# Patient Record
Sex: Male | Born: 1975 | Hispanic: No | Marital: Married | Smoking: Never smoker
Health system: Southern US, Community
[De-identification: ages and names within clinical notes are randomized; demographics above are authoritative.]

## PROBLEM LIST (undated history)

## (undated) ENCOUNTER — Ambulatory Visit

## (undated) DIAGNOSIS — E119 Type 2 diabetes mellitus without complications: Secondary | ICD-10-CM

## (undated) HISTORY — PX: ELBOW ARTHROSCOPY: SHX614

## (undated) HISTORY — PX: CLAVICLE SURGERY: SHX598

## (undated) HISTORY — DX: Type 2 diabetes mellitus without complications: E11.9

## (undated) HISTORY — PX: SHOULDER ARTHROSCOPY: SHX128

## (undated) HISTORY — PX: EXCISION ORAL TUMOR: SHX6265

---

## 2005-01-04 ENCOUNTER — Ambulatory Visit (HOSPITAL_COMMUNITY): Admission: RE | Admit: 2005-01-04 | Discharge: 2005-01-05 | Payer: Self-pay | Admitting: Orthopedic Surgery

## 2005-10-31 ENCOUNTER — Ambulatory Visit (HOSPITAL_BASED_OUTPATIENT_CLINIC_OR_DEPARTMENT_OTHER): Admission: RE | Admit: 2005-10-31 | Discharge: 2005-10-31 | Payer: Self-pay | Admitting: Orthopedic Surgery

## 2005-11-18 ENCOUNTER — Emergency Department (HOSPITAL_COMMUNITY): Admission: EM | Admit: 2005-11-18 | Discharge: 2005-11-19 | Payer: Self-pay | Admitting: Emergency Medicine

## 2006-02-20 ENCOUNTER — Ambulatory Visit (HOSPITAL_BASED_OUTPATIENT_CLINIC_OR_DEPARTMENT_OTHER): Admission: RE | Admit: 2006-02-20 | Discharge: 2006-02-20 | Payer: Self-pay | Admitting: Orthopedic Surgery

## 2012-07-25 ENCOUNTER — Emergency Department (HOSPITAL_COMMUNITY)
Admission: EM | Admit: 2012-07-25 | Discharge: 2012-07-25 | Disposition: A | Discharge status: Home / Self Care | Payer: No Typology Code available for payment source | Attending: Emergency Medicine | Admitting: Emergency Medicine

## 2012-07-25 ENCOUNTER — Encounter (HOSPITAL_COMMUNITY): Payer: Self-pay

## 2012-07-25 ENCOUNTER — Emergency Department (HOSPITAL_COMMUNITY): Payer: No Typology Code available for payment source

## 2012-07-25 DIAGNOSIS — N200 Calculus of kidney: Secondary | ICD-10-CM | POA: Insufficient documentation

## 2012-07-25 LAB — URINE MICROSCOPIC-ADD ON

## 2012-07-25 LAB — URINALYSIS, ROUTINE W REFLEX MICROSCOPIC
Glucose, UA: 250 mg/dL — AB
Leukocytes, UA: NEGATIVE
Protein, ur: 100 mg/dL — AB
pH: 6 (ref 5.0–8.0)

## 2012-07-25 LAB — BASIC METABOLIC PANEL
Calcium: 9.3 mg/dL (ref 8.4–10.5)
GFR calc Af Amer: >90 mL/min (ref >90)
GFR calc non Af Amer: >90 mL/min (ref >90)
Glucose, Bld: 218 mg/dL — ABNORMAL HIGH (ref 70–99)
Potassium: 4 mEq/L (ref 3.5–5.1)
Sodium: 132 mEq/L — ABNORMAL LOW (ref 135–145)

## 2012-07-25 MED ORDER — HYDROCODONE-ACETAMINOPHEN 5-325 MG PO TABS
1.0000 | ORAL_TABLET | Freq: Once | ORAL | Status: AC
  Administered 2012-07-25: 1 via ORAL
  Filled 2012-07-25: qty 1

## 2012-07-25 MED ORDER — IBUPROFEN 800 MG PO TABS
800.0000 mg | ORAL_TABLET | Freq: Once | ORAL | Status: AC
  Administered 2012-07-25: 800 mg via ORAL
  Filled 2012-07-25: qty 1

## 2012-07-25 MED ORDER — HYDROCODONE-ACETAMINOPHEN 5-325 MG PO TABS: 1.0000 | ORAL_TABLET | ORAL | Status: DC | PRN

## 2012-07-25 MED ORDER — ONDANSETRON 4 MG PO TBDP: 4.0000 mg | ORAL_TABLET | Freq: Three times a day (TID) | ORAL | Status: DC | PRN

## 2012-07-25 NOTE — ED Provider Notes (Signed)
History     CSN: [id number]  Arrival date & time 07/25/12  0103   First MD Initiated Contact with Patient 07/25/12 0208      Chief Complaint  Patient presents with  . Flank Pain    (Consider location/radiation/quality/duration/timing/severity/associated sxs/prior treatment) HPI HPI Comments: Kenneth Decker is a 37 y.o. male who presents to the Emergency Department complaining of  Right flank pain and right abdominal pain that woke him from sleep. Pain is continuous. He has no history of kidney stones. His wife advised he has had  pain in his  Right back for three days with radiation to his groin. Denies fever, chills, nausea, vomiting, hematuria, difficulty urinating. History reviewed. No pertinent past medical history.  History reviewed. No pertinent past surgical history.  No family history on file.  History  Substance Use Topics  . Smoking status: Never Smoker   . Smokeless tobacco: Not on file  . Alcohol Use: No      Review of Systems  Constitutional: Negative for fever.       10 Systems reviewed and are negative for acute change except as noted in the HPI.  HENT: Negative for congestion.   Eyes: Negative for discharge and redness.  Respiratory: Negative for cough and shortness of breath.   Cardiovascular: Negative for chest pain.  Gastrointestinal: Positive for abdominal pain. Negative for vomiting.  Genitourinary: Positive for flank pain.  Musculoskeletal: Negative for back pain.  Skin: Negative for rash.  Neurological: Negative for syncope, numbness and headaches.  Psychiatric/Behavioral:       No behavior change.    Allergies  Review of patient's allergies indicates no known allergies.  Home Medications  No current outpatient prescriptions on file.  BP 128/81  Pulse 83  Temp(Src) 98.9 F (37.2 C) (Oral)  Resp 18  Ht 6\' 1"  (1.854 m)  Wt 235 lb (106.595 kg)  BMI 31.01 kg/m2  SpO2 100%  Physical Exam  Nursing note and vitals  reviewed. Constitutional: He appears well-developed and well-nourished.  Awake, alert, nontoxic appearance.  HENT:  Head: Normocephalic and atraumatic.  Eyes: EOM are normal. Pupils are equal, round, and reactive to light.  Neck: Normal range of motion. Neck supple.  Cardiovascular: Normal rate and intact distal pulses.   Pulmonary/Chest: Effort normal and breath sounds normal. He exhibits no tenderness.  Abdominal: Soft. There is no tenderness. There is no rebound.  Genitourinary:  Right cva tenderness to percussion  Musculoskeletal: He exhibits no tenderness.  Baseline ROM, no obvious new focal weakness.  Neurological:  Mental status and motor strength appears baseline for patient and situation.  Skin: No rash noted.  Psychiatric: He has a normal mood and affect.    ED Course  Procedures (including critical care time) Results for orders placed during the hospital encounter of 07/25/12  URINALYSIS, ROUTINE W REFLEX MICROSCOPIC      Result Value Range   Color, Urine BROWN (*) YELLOW   APPearance HAZY (*) CLEAR   Specific Gravity, Urine >1.030 (*) 1.005 - 1.030   pH 6.0  5.0 - 8.0   Glucose, UA 250 (*) NEGATIVE mg/dL   Hgb urine dipstick LARGE (*) NEGATIVE   Bilirubin Urine NEGATIVE  NEGATIVE   Ketones, ur 15 (*) NEGATIVE mg/dL   Protein, ur 409 (*) NEGATIVE mg/dL   Urobilinogen, UA 1.0  0.0 - 1.0 mg/dL   Nitrite NEGATIVE  NEGATIVE   Leukocytes, UA NEGATIVE  NEGATIVE  URINE MICROSCOPIC-ADD ON      Result Value  Range   RBC / HPF TOO NUMEROUS TO COUNT  <3 RBC/hpf   Bacteria, UA MANY (*) RARE   Urine-Other MUCOUS PRESENT     Ct Abdomen Pelvis Wo Contrast  07/25/2012   *RADIOLOGY REPORT*  Clinical Data: Right flank pain and hematuria.  CT ABDOMEN AND PELVIS WITHOUT CONTRAST  Technique:  Multidetector CT imaging of the abdomen and pelvis was performed following the standard protocol without intravenous contrast.  Comparison: None  Findings: The lung bases are clear.  The solid  abdominal organs are normal. The gallbladder is normal. No common bile duct dilatation.  No renal calculi or hydronephrosis.  There is however the 3 mm right mid ureteral calculus located at the L3 vertebral body level without obstructive findings.  No left-sided ureteral calculi.  No bladder calculi.  The stomach, duodenum, small bowel and colon are grossly normal without oral contrast.  The appendix is normal.  The bladder, prostate gland and seminal vesicles are normal.  No pelvic mass or adenopathy.  The aorta is normal in caliber.  No mesenteric or retroperitoneal mass or adenopathy.  IMPRESSION: 3 mm nonobstructing right mid ureteral calculus.   Original Report Authenticated By: Rudie Meyer, M.D.   Medications  HYDROcodone-acetaminophen (NORCO/VICODIN) 5-325 MG per tablet 1 tablet (1 tablet Oral Given 07/25/12 0228)  ibuprofen (ADVIL,MOTRIN) tablet 800 mg (800 mg Oral Given 07/25/12 0228)      MDM  Patient presents with right sided flank pain and abdominal pain. UA with hgb. CT scan showing a 3 mm mid ureteral stone. Satinet was given hydrocodone and ibuprofen for pain with relief. Reviewed results with patient and his wife. Referral to urology. Pt stable in ED with no significant deterioration in condition.The patient appears reasonably screened and/or stabilized for discharge and I doubt any other medical condition or other [facility] requiring further screening, evaluation, or treatment in the ED at this time prior to discharge.  MDM Reviewed: nursing note and vitals Interpretation: labs and CT scan           Nicoletta Dress. Colon Branch, MD 07/25/12 [phone]

## 2012-10-25 ENCOUNTER — Ambulatory Visit (INDEPENDENT_AMBULATORY_CARE_PROVIDER_SITE_OTHER): Payer: No Typology Code available for payment source | Admitting: Internal Medicine

## 2012-10-25 ENCOUNTER — Ambulatory Visit: Payer: No Typology Code available for payment source

## 2012-10-25 ENCOUNTER — Encounter: Payer: Self-pay | Admitting: Internal Medicine

## 2012-10-25 VITALS — BP 120/70 | HR 77 | Temp 99.0°F | Resp 16 | Ht 72.25 in | Wt 227.4 lb

## 2012-10-25 DIAGNOSIS — S139XXA Sprain of joints and ligaments of unspecified parts of neck, initial encounter: Secondary | ICD-10-CM

## 2012-10-25 DIAGNOSIS — R079 Chest pain, unspecified: Secondary | ICD-10-CM

## 2012-10-25 DIAGNOSIS — S161XXA Strain of muscle, fascia and tendon at neck level, initial encounter: Secondary | ICD-10-CM

## 2012-10-25 DIAGNOSIS — M542 Cervicalgia: Secondary | ICD-10-CM

## 2012-10-25 DIAGNOSIS — R110 Nausea: Secondary | ICD-10-CM

## 2012-10-25 LAB — POCT CBC
HCT, POC: 49.8 % (ref 43.5–53.7)
Hemoglobin: 16.2 g/dL (ref 14.1–18.1)
Lymph, poc: 1.3 (ref 0.6–3.4)
MCH, POC: 30.2 pg (ref 27–31.2)
MCHC: 32.5 g/dL (ref 31.8–35.4)
MCV: 92.7 fL (ref 80–97)
POC Granulocyte: 4.3 (ref 2–6.9)
POC LYMPH PERCENT: 21.6 %L (ref 10–50)
RDW, POC: 14.1 %
WBC: 5.9 10*3/uL (ref 4.6–10.2)

## 2012-10-25 LAB — POCT SEDIMENTATION RATE: POCT SED RATE: 4 mm/hr (ref 0–22)

## 2012-10-25 MED ORDER — METHOCARBAMOL 750 MG PO TABS: 750.0000 mg | ORAL_TABLET | Freq: Four times a day (QID) | ORAL | Status: DC

## 2012-10-25 MED ORDER — HYDROCODONE-ACETAMINOPHEN 5-325 MG PO TABS: 1.0000 | ORAL_TABLET | Freq: Four times a day (QID) | ORAL | Status: DC | PRN

## 2012-10-25 NOTE — Progress Notes (Signed)
  Subjective:    Patient ID: Kenneth Decker, male    DOB: May 04, 1975, 37 y.o.   MRN: [id number]  HPI    Review of Systems     Objective:   Physical Exam        Assessment & Plan:

## 2012-10-25 NOTE — Progress Notes (Signed)
  Subjective:    Patient ID: Kenneth Decker, male    DOB: 12-06-1975, 37 y.o.   MRN: [id number]  HPI  37 year old male for shoulder and neck pain for one week and went into the chest today. Pain in the chest is constant.  Feels some nausea today afraid to eat today.   No other symptoms.  No known injury.  Pain goes down to shoulder on left side.  Throbbing pain in chest.  No shortness of breath.  Painful to flex or extend neck, rotating left worse than right. No remembered trauma.    Review of Systems     Objective:   Physical Exam  Vitals reviewed. Constitutional: He is oriented to person, place, and time. He appears well-developed and well-nourished. He appears distressed.  HENT:  Right Ear: External ear normal.  Left Ear: External ear normal.  Nose: Nose normal.  Mouth/Throat: Oropharynx is clear and moist.  Eyes: EOM are normal. Pupils are equal, round, and reactive to light.  Neck: Neck supple. No tracheal deviation present. No thyromegaly present.  Cardiovascular: Normal rate, regular rhythm, normal heart sounds and intact distal pulses.   Pulmonary/Chest: Effort normal.  Musculoskeletal:       Cervical back: He exhibits decreased range of motion, tenderness, pain and spasm. He exhibits no bony tenderness, no swelling, no edema, no deformity, no laceration and normal pulse.       Back:  Lymphadenopathy:    He has no cervical adenopathy.  Neurological: He is alert and oriented to person, place, and time. He has normal strength and normal reflexes. He displays no atrophy. No cranial nerve deficit or sensory deficit. He exhibits normal muscle tone. Coordination normal.  Psychiatric: He has a normal mood and affect. His behavior is normal.  in pain   [facility] reading (PRIMARY) by  Dr Perrin Maltese. C6 appears compressed, rest nl and cxr nl  EKG nl Results for orders placed in visit on 10/25/12  POCT CBC      Result Value Range   WBC 5.9  4.6 - 10.2 K/uL   Lymph, poc 1.3  0.6 - 3.4   POC LYMPH PERCENT 21.6  10 - 50 %L   MID (cbc) 0.4  0 - 0.9   POC MID % 6.3  0 - 12 %M   POC Granulocyte 4.3  2 - 6.9   Granulocyte percent 72.1  37 - 80 %G   RBC 5.37  4.69 - 6.13 M/uL   Hemoglobin 16.2  14.1 - 18.1 g/dL   HCT, POC 14.7  82.9 - 53.7 %   MCV 92.7  80 - 97 fL   MCH, POC 30.2  27 - 31.2 pg   MCHC 32.5  31.8 - 35.4 g/dL   RDW, POC 56.2     Platelet Count, POC 231  142 - 424 K/uL   MPV 9.2  0 - 99.8 fL       Assessment & Plan:  Stop tobacco-Nicorrete discussed RICE/Neck care/Robaxin/Vicodin Reck 3days if not better and consider MRI

## 2012-10-25 NOTE — Patient Instructions (Signed)
Gastroesophageal Reflux Disease, Adult  Gastroesophageal reflux disease (GERD) happens when acid from your stomach flows up into the esophagus. When acid comes in contact with the esophagus, the acid causes soreness (inflammation) in the esophagus. Over time, GERD may create small holes (ulcers) in the lining of the esophagus.  CAUSES   · Increased body weight. This puts pressure on the stomach, making acid rise from the stomach into the esophagus.  · Smoking. This increases acid production in the stomach.  · Drinking alcohol. This causes decreased pressure in the lower esophageal sphincter (valve or ring of muscle between the esophagus and stomach), allowing acid from the stomach into the esophagus.  · Late evening meals and a full stomach. This increases pressure and acid production in the stomach.  · A malformed lower esophageal sphincter.  Sometimes, no cause is found.  SYMPTOMS   · Burning pain in the lower part of the mid-chest behind the breastbone and in the mid-stomach area. This may occur twice a week or more often.  · Trouble swallowing.  · Sore throat.  · Dry cough.  · Asthma-like symptoms including chest tightness, shortness of breath, or wheezing.  DIAGNOSIS   Your caregiver may be able to diagnose GERD based on your symptoms. In some cases, X-rays and other tests may be done to check for complications or to check the condition of your stomach and esophagus.  TREATMENT   Your caregiver may recommend over-the-counter or prescription medicines to help decrease acid production. Ask your caregiver before starting or adding any new medicines.   HOME CARE INSTRUCTIONS   · Change the factors that you can control. Ask your caregiver for guidance concerning weight loss, quitting smoking, and alcohol consumption.  · Avoid foods and drinks that make your symptoms worse, such as:  · Caffeine or alcoholic drinks.  · Chocolate.  · Peppermint or mint flavorings.  · Garlic and onions.  · Spicy foods.  · Citrus fruits,  such as oranges, lemons, or limes.  · Tomato-based foods such as sauce, chili, salsa, and pizza.  · Fried and fatty foods.  · Avoid lying down for the 3 hours prior to your bedtime or prior to taking a nap.  · Eat small, frequent meals instead of large meals.  · Wear loose-fitting clothing. Do not wear anything tight around your waist that causes pressure on your stomach.  · Raise the head of your bed 6 to 8 inches with wood blocks to help you sleep. Extra pillows will not help.  · Only take over-the-counter or prescription medicines for pain, discomfort, or fever as directed by your caregiver.  · Do not take aspirin, ibuprofen, or other nonsteroidal anti-inflammatory drugs (NSAIDs).  SEEK IMMEDIATE MEDICAL CARE IF:   · You have pain in your arms, neck, jaw, teeth, or back.  · Your pain increases or changes in intensity or duration.  · You develop nausea, vomiting, or sweating (diaphoresis).  · You develop shortness of breath, or you faint.  · Your vomit is green, yellow, black, or looks like coffee grounds or blood.  · Your stool is red, bloody, or black.  These symptoms could be signs of other problems, such as heart disease, gastric bleeding, or esophageal bleeding.  MAKE SURE YOU:   · Understand these instructions.  · Will watch your condition.  · Will get help right away if you are not doing well or get worse.  Document Released: 11/30/2004 Document Revised: 05/15/2011 Document Reviewed: 09/09/2010  [employer]® Patient   Information ©2014 [employer].

## 2015-04-11 ENCOUNTER — Ambulatory Visit (INDEPENDENT_AMBULATORY_CARE_PROVIDER_SITE_OTHER): Payer: 59 | Admitting: Physician Assistant

## 2015-04-11 VITALS — BP 116/88 | HR 76 | Temp 98.3°F | Resp 16 | Ht 74.0 in | Wt 224.0 lb

## 2015-04-11 DIAGNOSIS — Z13220 Encounter for screening for lipoid disorders: Secondary | ICD-10-CM | POA: Diagnosis not present

## 2015-04-11 DIAGNOSIS — E119 Type 2 diabetes mellitus without complications: Secondary | ICD-10-CM | POA: Diagnosis not present

## 2015-04-11 LAB — COMPREHENSIVE METABOLIC PANEL
ALT: 26 U/L (ref 9–46)
AST: 15 U/L (ref 10–40)
Albumin: 4.7 g/dL (ref 3.6–5.1)
Alkaline Phosphatase: 82 U/L (ref 40–115)
BILIRUBIN TOTAL: 0.7 mg/dL (ref 0.2–1.2)
BUN: 14 mg/dL (ref 7–25)
CALCIUM: 9.3 mg/dL (ref 8.6–10.3)
CHLORIDE: 100 mmol/L (ref 98–110)
CO2: 29 mmol/L (ref 20–31)
CREATININE: 0.7 mg/dL (ref 0.60–1.35)
Glucose, Bld: 146 mg/dL — ABNORMAL HIGH (ref 65–99)
POTASSIUM: 4 mmol/L (ref 3.5–5.3)
Sodium: 136 mmol/L (ref 135–146)
TOTAL PROTEIN: 7.1 g/dL (ref 6.1–8.1)

## 2015-04-11 LAB — LIPID PANEL
CHOLESTEROL: 172 mg/dL (ref 125–200)
HDL: 31 mg/dL — ABNORMAL LOW (ref >=40)
LDL Cholesterol: 89 mg/dL (ref <130)
Total CHOL/HDL Ratio: 5.5 Ratio — ABNORMAL HIGH (ref <=5.0)
Triglycerides: 262 mg/dL — ABNORMAL HIGH (ref <150)
VLDL: 52 mg/dL — AB (ref <30)

## 2015-04-11 LAB — CBC
HCT: 46.8 % (ref 39.0–52.0)
Hemoglobin: 16.4 g/dL (ref 13.0–17.0)
MCH: 30.3 pg (ref 26.0–34.0)
MCHC: 35 g/dL (ref 30.0–36.0)
MCV: 86.5 fL (ref 78.0–100.0)
MPV: 10.4 fL (ref 8.6–12.4)
PLATELETS: 225 10*3/uL (ref 150–400)
RBC: 5.41 MIL/uL (ref 4.22–5.81)
RDW: 13.6 % (ref 11.5–15.5)
WBC: 4.9 10*3/uL (ref 4.0–10.5)

## 2015-04-11 LAB — POCT GLYCOSYLATED HEMOGLOBIN (HGB A1C)
Hemoglobin A1C: 10.4
Hemoglobin A1C: 8.8

## 2015-04-11 LAB — MICROALBUMIN, URINE: Microalb, Ur: 12.3 mg/dL

## 2015-04-11 MED ORDER — METFORMIN HCL 1000 MG PO TABS: 1000.0000 mg | ORAL_TABLET | Freq: Two times a day (BID) | ORAL | Status: AC

## 2015-04-11 NOTE — Patient Instructions (Addendum)
Fasting blood sugar, goal <140 Nonfasting, goal <180-200 Goal A1C <7.0  Continue to work on diet, exercise and weight loss. Check blood sugar daily, esp fasting when you wake up. Take metformin 1000 mg twice a day. Return in 3 months for follow up.  Next time we will talk about getting the pneumonia vaccine.

## 2015-04-11 NOTE — Progress Notes (Signed)
[facility] [address] [place] [address] [phone]  Date:  04/11/2015   Name:  Kenneth Decker   DOB:  10-Jun-1975   MRN:  [id number]  PCP:  No PCP Per Patient   Chief Complaint: Medication Refill  History of Present Illness:  This is a 40 y.o. male with PMH DM2 who is presenting needing refill of metformin. He has never been seen here before. States he was at an urgent care in [place], [place] 1 month ago and told he has diabetes. Blood glucose 400 at that time. Placed on metformin 500 mg BID. Doing well with the metformin - had diarrhea first couple days, has gone away now.  Has changed his diet tremendously over the past 1 month. Was drinking 4 sun drops per day. Has not had any soda since. Staying away from white, carby foods. No desserts. He is walking 1 mile every night with his wife for exercise. Checking BG daily - low 101, high 199 Thinks he has lost 7-8 pounds in the past 1 month. Dad has DM2. Skeptical of vaccines - declines flu and pneumonia today. No PCP. Denies abdominal pain, cp, sob, paresthesias.  Review of Systems:  Review of Systems See HPI  There are no active problems to display for this patient.   Prior to Admission medications   Medication Sig Start Date End Date Taking? Authorizing Provider  metFORMIN (GLUCOPHAGE) 500 MG tablet Take 500 mg by mouth 2 (two) times daily with a meal.   Yes Historical Provider, MD    No Known Allergies  History reviewed. No pertinent past surgical history.  Social History  Substance Use Topics  . Smoking status: Never Smoker   . Smokeless tobacco: None  . Alcohol Use: No    History reviewed. No pertinent family history.  Medication list has been reviewed and updated.  Physical Examination:  Physical Exam  Constitutional: He is oriented to person, place, and time. He appears well-developed and well-nourished. No distress.  HENT:  Head: Normocephalic and atraumatic.  Right Ear: Hearing  normal.  Left Ear: Hearing normal.  Nose: Nose normal.  Eyes: Conjunctivae and lids are normal. Right eye exhibits no discharge. Left eye exhibits no discharge. No scleral icterus.  Cardiovascular: Normal rate, regular rhythm, normal heart sounds and normal pulses.   No murmur heard. Pulmonary/Chest: Effort normal and breath sounds normal. No respiratory distress. He has no wheezes. He has no rhonchi. He has no rales.  Musculoskeletal: Normal range of motion.  Lymphadenopathy:    He has no cervical adenopathy.  Neurological: He is alert and oriented to person, place, and time.  Diabetic Foot Exam - Simple   Visual Inspection  No deformities, no ulcerations, no other skin breakdown bilaterally:  Yes  Sensation Testing  Intact to touch and monofilament testing bilaterally:  Yes  Pulse Check  Posterior Tibialis and Dorsalis pulse intact bilaterally:  Yes      Skin: Skin is warm, dry and intact. No lesion and no rash noted.  Psychiatric: He has a normal mood and affect. His speech is normal and behavior is normal. Thought content normal.   BP 116/88 mmHg  Pulse 76  Temp(Src) 98.3 F (36.8 C)  Resp 16  Ht  (1.88 m)  Wt 224 lb (101.606 kg)  BMI 28.75 kg/m2  SpO2 98%  Results for orders placed or performed in visit on 04/11/15  POCT glycosylated hemoglobin (Hb A1C)  Result Value Ref Range  Hemoglobin A1C 10.4    Assessment and Plan:  1. Type 2 diabetes mellitus without complication, without long-term current use of insulin (HCC) 2. Encounter for diabetic foot exam A1C 10.4. Will increase metformin to 1000 mg BID. Continue to check bg daily. Planted seed today for starting lisinopril 5 mg QD at next visit for kidney protection. Also encouraged him to think about getting pneumonia vaccine. Encouraged to get eye exam. Congratulated on diet/exercise changes - return in 3 months for follow up. - Comprehensive metabolic panel - CBC - Microalbumin, urine - POCT glycosylated  hemoglobin (Hb A1C) - metFORMIN (GLUCOPHAGE) 1000 MG tablet; Take 1 tablet (1,000 mg total) by mouth 2 (two) times daily with a meal.  Dispense: 60 tablet; Refill: 5  3. Lipid screening - Lipid panel   [place] [employer]. Dyke Brackett, MHS [facility]  04/11/2015

## 2015-04-16 ENCOUNTER — Other Ambulatory Visit: Payer: Self-pay | Admitting: Physician Assistant

## 2015-04-16 DIAGNOSIS — E119 Type 2 diabetes mellitus without complications: Secondary | ICD-10-CM

## 2015-04-16 DIAGNOSIS — E785 Hyperlipidemia, unspecified: Secondary | ICD-10-CM | POA: Insufficient documentation

## 2015-04-16 MED ORDER — ATORVASTATIN CALCIUM 10 MG PO TABS: 10.0000 mg | ORAL_TABLET | Freq: Every day | ORAL | Status: AC

## 2015-06-29 ENCOUNTER — Ambulatory Visit (INDEPENDENT_AMBULATORY_CARE_PROVIDER_SITE_OTHER): Payer: 59 | Admitting: Family Medicine

## 2015-06-29 ENCOUNTER — Encounter: Payer: Self-pay | Admitting: Family Medicine

## 2015-06-29 VITALS — BP 134/82 | HR 81 | Temp 98.5°F | Resp 16 | Ht 74.0 in | Wt 225.0 lb

## 2015-06-29 DIAGNOSIS — E119 Type 2 diabetes mellitus without complications: Secondary | ICD-10-CM | POA: Diagnosis not present

## 2015-06-29 DIAGNOSIS — N529 Male erectile dysfunction, unspecified: Secondary | ICD-10-CM

## 2015-06-29 LAB — GLUCOSE, POCT (MANUAL RESULT ENTRY): POC Glucose: 179 mg/dl — AB (ref 70–99)

## 2015-06-29 NOTE — Patient Instructions (Addendum)
   IF you received an x-ray today, you will receive an invoice from [facility]. Please contact [facility] at [phone] with questions or concerns regarding your invoice.   IF you received labwork today, you will receive an invoice from [facility]. Please contact [facility] at [phone] with questions or concerns regarding your invoice.   Our billing staff will not be able to assist you with questions regarding bills from these companies.  You will be contacted with the lab results as soon as they are available. The fastest way to get your results is to activate your My Chart account. Instructions are located on the last page of this paperwork. If you have not heard from us regarding the results in 2 weeks, please contact this office.       Why follow it? Research shows. . Those who follow the Mediterranean diet have a reduced risk of heart disease  . The diet is associated with a reduced incidence of Parkinson's and Alzheimer's diseases . People following the diet may have longer life expectancies and lower rates of chronic diseases  . The Dietary Guidelines for Americans recommends the Mediterranean diet as an eating plan to promote health and prevent disease  What Is the Mediterranean Diet?  . Healthy eating plan based on typical foods and recipes of Mediterranean-style cooking . The diet is primarily a plant based diet; these foods should make up a majority of meals   Starches - Plant based foods should make up a majority of meals - They are an important sources of vitamins, minerals, energy, antioxidants, and fiber - Choose whole grains, foods high in fiber and minimally processed items  - Typical grain sources include wheat, oats, barley, corn, brown rice, bulgar, farro, millet, polenta, couscous  - Various types of beans include chickpeas, lentils, fava beans, black beans, white beans   Fruits  Veggies - Large quantities of  antioxidant rich fruits & veggies; 6 or more servings  - Vegetables can be eaten raw or lightly drizzled with oil and cooked  - Vegetables common to the traditional Mediterranean Diet include: artichokes, arugula, beets, broccoli, brussel sprouts, cabbage, carrots, celery, collard greens, cucumbers, eggplant, kale, leeks, lemons, lettuce, mushrooms, okra, onions, peas, peppers, potatoes, pumpkin, radishes, rutabaga, shallots, spinach, sweet potatoes, turnips, zucchini - Fruits common to the Mediterranean Diet include: apples, apricots, avocados, cherries, clementines, dates, figs, grapefruits, grapes, melons, nectarines, oranges, peaches, pears, pomegranates, strawberries, tangerines  Fats - Replace butter and margarine with healthy oils, such as olive oil, canola oil, and tahini  - Limit nuts to no more than a handful a day  - Nuts include walnuts, almonds, pecans, pistachios, pine nuts  - Limit or avoid candied, honey roasted or heavily salted nuts - Olives are central to the Mediterranean diet - can be eaten whole or used in a variety of dishes   Meats Protein - Limiting red meat: no more than a few times a month - When eating red meat: choose lean cuts and keep the portion to the size of deck of cards - Eggs: approx. 0 to 4 times a week  - Fish and lean poultry: at least 2 a week  - Healthy protein sources include, chicken, turkey, lean beef, lamb - Increase intake of seafood such as tuna, salmon, trout, mackerel, shrimp, scallops - Avoid or limit high fat processed meats such as sausage and bacon  Dairy - Include moderate amounts of low fat dairy products  - Focus on healthy   dairy such as fat free yogurt, skim milk, low or reduced fat cheese - Limit dairy products higher in fat such as whole or 2% milk, cheese, ice cream  Alcohol - Moderate amounts of red wine is ok  - No more than 5 oz daily for women (all ages) and men older than age 65  - No more than 10 oz of wine daily for men younger  than 65  Other - Limit sweets and other desserts  - Use herbs and spices instead of salt to flavor foods  - Herbs and spices common to the traditional Mediterranean Diet include: basil, bay leaves, chives, cloves, cumin, fennel, garlic, lavender, marjoram, mint, oregano, parsley, pepper, rosemary, sage, savory, sumac, tarragon, thyme   It's not just a diet, it's a lifestyle:  . The Mediterranean diet includes lifestyle factors typical of those in the region  . Foods, drinks and meals are best eaten with others and savored . Daily physical activity is important for overall good health . This could be strenuous exercise like running and aerobics . This could also be more leisurely activities such as walking, housework, yard-work, or taking the stairs . Moderation is the key; a balanced and healthy diet accommodates most foods and drinks . Consider portion sizes and frequency of consumption of certain foods   Meal Ideas & Options:  . Breakfast:  o Whole wheat toast or whole wheat English muffins with peanut butter & hard boiled egg o Steel cut oats topped with apples & cinnamon and skim milk  o Fresh fruit: banana, strawberries, melon, berries, peaches  o Smoothies: strawberries, bananas, greek yogurt, peanut butter o Low fat greek yogurt with blueberries and granola  o Egg white omelet with spinach and mushrooms o Breakfast couscous: whole wheat couscous, apricots, skim milk, cranberries  . Sandwiches:  o Hummus and grilled vegetables (peppers, zucchini, squash) on whole wheat bread   o Grilled chicken on whole wheat pita with lettuce, tomatoes, cucumbers or tzatziki  o Tuna salad on whole wheat bread: tuna salad made with greek yogurt, olives, red peppers, capers, green onions o Garlic rosemary lamb pita: lamb sauted with garlic, rosemary, salt & pepper; add lettuce, cucumber, greek yogurt to pita - flavor with lemon juice and black pepper  . Seafood:  o Mediterranean grilled salmon,  seasoned with garlic, basil, parsley, lemon juice and black pepper o Shrimp, lemon, and spinach whole-grain pasta salad made with low fat greek yogurt  o Seared scallops with lemon orzo  o Seared tuna steaks seasoned salt, pepper, coriander topped with tomato mixture of olives, tomatoes, olive oil, minced garlic, parsley, green onions and cappers  . Meats:  o Herbed greek chicken salad with kalamata olives, cucumber, feta  o Red bell peppers stuffed with spinach, bulgur, lean ground beef (or lentils) & topped with feta   o Kebabs: skewers of chicken, tomatoes, onions, zucchini, squash  o Turkey burgers: made with red onions, mint, dill, lemon juice, feta cheese topped with roasted red peppers . Vegetarian o Cucumber salad: cucumbers, artichoke hearts, celery, red onion, feta cheese, tossed in olive oil & lemon juice  o Hummus and whole grain pita points with a greek salad (lettuce, tomato, feta, olives, cucumbers, red onion) o Lentil soup with celery, carrots made with vegetable broth, garlic, salt and pepper  o Tabouli salad: parsley, bulgur, mint, scallions, cucumbers, tomato, radishes, lemon juice, olive oil, salt and pepper.      

## 2015-06-29 NOTE — Progress Notes (Signed)
Subjective:    Patient ID: Kenneth Decker, male    DOB: August 19, 1975, 40 y.o.   MRN: [id number]  HPI This is a pleasant 40 yo male who presents today for follow up of DM. 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 He works an average of 60 hours a week. He was doing really well with his diet at first but has fallen off some. He has changed trucks and no longer has a refrigerator. He has purchased a new cooler and plans to brings more of his food on the road with him. He had an eye exam scheduled for last week, the provider had to cancel and the patient understands the importance of rescheduling. He and his wife do the grocery shopping together and they have been working hard to eliminate unhealthy choices for their sake as well as their 718 yo daughter. He was rapidly losing weight prior to DM diagnosis and starting medications. He was also urinating every hour and was very thirsty. This has resolved.   He has had difficulty maintaining an erection since starting on metformin and atorvastatin. Libido unchanged, no difficulty obtaining an erection. 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   Past Medical History  Diagnosis Date  . Diabetes mellitus without complication (HCC)    History reviewed. No pertinent past surgical history. History reviewed. No pertinent family history. Social History  Substance Use Topics  . Smoking status: Never Smoker   . Smokeless tobacco: None  . Alcohol Use: No      Review of Systems No chest pain, no SOB, no neuropathic symptoms. No sores or rash.     Objective:   Physical Exam Physical Exam  Constitutional: Oriented to person, place, and time. He appears well-developed and well-nourished.  HENT:  Head: Normocephalic and atraumatic.  Eyes: Conjunctivae are normal.  Neck: Normal range of motion. Neck supple.  Cardiovascular: Normal rate, regular rhythm and normal heart sounds.   Pulmonary/Chest: Effort normal and  breath sounds normal.  Musculoskeletal: Normal range of motion.  Neurological: Alert and oriented to person, place, and time.  Skin: Skin is warm and dry.  Psychiatric: Normal mood and affect. Behavior is normal. Judgment and thought content normal.  Vitals reviewed.  BP 134/82 mmHg  Pulse 81  Temp(Src) 98.5 F (36.9 C) (Oral)  Resp 16  Ht 6\' 2"  (1.88 m)  Wt 225 lb (102.059 kg)  BMI 28.88 kg/m2 Wt Readings from Last 3 Encounters:  06/29/15 225 lb (102.059 kg)  04/11/15 224 lb (101.606 kg)  10/25/12 227 lb 6.4 oz (103.148 kg)  Heaviest weight was 255 Results for orders placed or performed in visit on 06/29/15  POCT glycosylated hemoglobin (Hb A1C)  Result Value Ref Range   Hemoglobin A1C 8.8   POCT glucose (manual entry)  Result Value Ref Range   POC Glucose 179 (A) 70 - 99 mg/dl  [id number] 2/9/522/5/17- 84.110.4     Assessment & Plan:  1. Type 2 diabetes mellitus without complication, without long-term current use of insulin (HCC) - POCT glycosylated hemoglobin (Hb A1C) - POCT glucose (manual entry) - discussed improved results with patient and encouraged continued healthy food choices and exercise - reschedule eye exam  2. 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

## 2015-07-13 ENCOUNTER — Ambulatory Visit: Payer: 59 | Admitting: Physician Assistant

## 2016-01-28 ENCOUNTER — Emergency Department (HOSPITAL_COMMUNITY): Payer: 59

## 2016-01-28 ENCOUNTER — Emergency Department (HOSPITAL_COMMUNITY)
Admission: EM | Admit: 2016-01-28 | Discharge: 2016-01-28 | Disposition: A | Discharge status: Home / Self Care | Payer: 59 | Attending: Emergency Medicine | Admitting: Emergency Medicine

## 2016-01-28 ENCOUNTER — Encounter (HOSPITAL_COMMUNITY): Payer: Self-pay | Admitting: Emergency Medicine

## 2016-01-28 DIAGNOSIS — S53402A Unspecified sprain of left elbow, initial encounter: Secondary | ICD-10-CM | POA: Insufficient documentation

## 2016-01-28 DIAGNOSIS — Y9389 Activity, other specified: Secondary | ICD-10-CM | POA: Insufficient documentation

## 2016-01-28 DIAGNOSIS — Y92410 Unspecified street and highway as the place of occurrence of the external cause: Secondary | ICD-10-CM | POA: Diagnosis not present

## 2016-01-28 DIAGNOSIS — Z79899 Other long term (current) drug therapy: Secondary | ICD-10-CM | POA: Insufficient documentation

## 2016-01-28 DIAGNOSIS — Z7984 Long term (current) use of oral hypoglycemic drugs: Secondary | ICD-10-CM | POA: Insufficient documentation

## 2016-01-28 DIAGNOSIS — S59902A Unspecified injury of left elbow, initial encounter: Secondary | ICD-10-CM | POA: Diagnosis present

## 2016-01-28 DIAGNOSIS — E119 Type 2 diabetes mellitus without complications: Secondary | ICD-10-CM | POA: Insufficient documentation

## 2016-01-28 DIAGNOSIS — Y999 Unspecified external cause status: Secondary | ICD-10-CM | POA: Insufficient documentation

## 2016-01-28 MED ORDER — IBUPROFEN 400 MG PO TABS
600.0000 mg | ORAL_TABLET | Freq: Once | ORAL | Status: AC
  Administered 2016-01-28: 600 mg via ORAL
  Filled 2016-01-28: qty 2
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
## 2016-01-28 NOTE — ED Notes (Signed)
Pt alert & oriented x4, stable gait. Patient given discharge instructions, paperwork & prescription(s). Patient  instructed to stop at the registration desk to finish any additional paperwork. Patient verbalized understanding. Pt left department w/ no further questions. 

## 2016-01-28 NOTE — ED Notes (Signed)
Pt states when he moved fingers he is having pain running up his arm to the elbow. Pt has good sensation & cap refill.
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
## 2021-02-19 ENCOUNTER — Ambulatory Visit
Admission: EM | Admit: 2021-02-19 | Discharge: 2021-02-19 | Disposition: A | Discharge status: Home / Self Care | Payer: 59

## 2021-02-19 ENCOUNTER — Other Ambulatory Visit: Payer: Self-pay

## 2021-02-19 DIAGNOSIS — J069 Acute upper respiratory infection, unspecified: Secondary | ICD-10-CM | POA: Diagnosis not present

## 2021-02-19 DIAGNOSIS — Z20828 Contact with and (suspected) exposure to other viral communicable diseases: Secondary | ICD-10-CM | POA: Diagnosis not present

## 2021-02-19 MED ORDER — PREDNISONE 20 MG PO TABS: 40.0000 mg | ORAL_TABLET | Freq: Every day | ORAL | 0 refills | Status: AC

## 2021-02-19 MED ORDER — PROMETHAZINE-DM 6.25-15 MG/5ML PO SYRP: 5.0000 mL | ORAL_SOLUTION | Freq: Four times a day (QID) | ORAL | 0 refills | Status: AC | PRN

## 2021-02-19 MED ORDER — LIDOCAINE VISCOUS HCL 2 % MT SOLN: 10.0000 mL | OROMUCOSAL | 0 refills | Status: AC | PRN

## 2021-02-19 NOTE — ED Triage Notes (Signed)
Patient states he has been sick for about a week.   He states he has a bad cough and his throat is very sore.   He states he has been taking Mucinex.  He states he has had a fever this week.

## 2021-02-19 NOTE — ED Provider Notes (Signed)
[facility]    CSN: [id number] Arrival date & time: 02/19/21  0803      History   Chief Complaint Chief Complaint  Patient presents with   Sore Throat    Sore throat and bad cough    HPI Kenneth Decker is a 45 y.o. male.   Patient presenting today with almost a week of hacking cough, sore throat, fever, fatigue, chills, congestion, headache.  Denies chest pain, shortness of breath, abdominal pain, nausea vomiting or diarrhea.  So far taking Mucinex with minimal temporary relief of symptoms.  Multiple sick contacts recently.  No pertinent chronic medical problems.   Past Medical History:  Diagnosis Date   Diabetes mellitus without complication [facility])     Patient Active Problem List   Diagnosis Date Noted   Diabetes mellitus type 2, uncomplicated (HCC) 04/16/2015   HLD (hyperlipidemia) 04/16/2015    Past Surgical History:  Procedure Laterality Date   CLAVICLE SURGERY     ELBOW ARTHROSCOPY     EXCISION ORAL TUMOR     SHOULDER ARTHROSCOPY         Home Medications    Prior to Admission medications   Medication Sig Start Date End Date Taking? Authorizing Provider  lidocaine (XYLOCAINE) 2 % solution Use as directed 10 mLs in the mouth or throat as needed for mouth pain. 02/19/21  Yes Particia Nearing, PA-C  predniSONE (DELTASONE) 20 MG tablet Take 2 tablets (40 mg total) by mouth daily with breakfast. 02/19/21  Yes Particia Nearing, PA-C  promethazine-dextromethorphan (PROMETHAZINE-DM) 6.25-15 MG/5ML syrup Take 5 mLs by mouth 4 (four) times daily as needed. 02/19/21  Yes Particia Nearing, PA-C  atorvastatin (LIPITOR) 10 MG tablet Take 1 tablet (10 mg total) by mouth daily. 04/16/15   Dorna Leitz, PA-C  losartan (COZAAR) 25 MG tablet Take 25 mg by mouth daily. 02/18/21   [provider]  metFORMIN (GLUCOPHAGE) 1000 MG tablet Take 1 tablet (1,000 mg total) by mouth 2 (two) times daily with a meal. 04/11/15   Dorna Leitz, PA-C   ONGLYZA 5 MG TABS tablet Take 5 mg by mouth daily. 01/28/21   [provider]  Vitamin D, Ergocalciferol, (DRISDOL) 1.25 MG (50000 UNIT) CAPS capsule Take 50,000 Units by mouth once a week. 01/30/21   [provider]    Family History Family History  Problem Relation Age of Onset   Diabetes Other     Social History Social History   Tobacco Use   Smoking status: Never   Smokeless tobacco: Never  Vaping Use   Vaping Use: Never used  Substance Use Topics   Alcohol use: No   Drug use: No     Allergies   Patient has no known allergies.   Review of Systems Review of Systems Per HPI  Physical Exam Triage Vital Signs ED Triage Vitals  Enc Vitals Group     BP 02/19/21 0814 111/77     Pulse Rate 02/19/21 0814 98     Resp 02/19/21 0814 20     Temp 02/19/21 0814 98.4 F (36.9 C)     Temp Source 02/19/21 0814 Oral     SpO2 02/19/21 0814 95 %     Weight --      Height --      Head Circumference --      Peak Flow --      Pain Score 02/19/21 0812 7     Pain Loc --  Pain Edu? --      Excl. in GC? --    No data found.  Updated Vital Signs BP 111/77 (BP Location: Right Arm)    Pulse 98    Temp 98.4 F (36.9 C) (Oral)    Resp 20    SpO2 95%   Visual Acuity Right Eye Distance:   Left Eye Distance:   Bilateral Distance:    Right Eye Near:   Left Eye Near:    Bilateral Near:     Physical Exam Vitals and nursing note reviewed.  Constitutional:      Appearance: Normal appearance. He is well-developed.  HENT:     Head: Atraumatic.     Right Ear: External ear normal.     Left Ear: External ear normal.     Nose: Rhinorrhea present.     Mouth/Throat:     Pharynx: Posterior oropharyngeal erythema present. No oropharyngeal exudate.  Eyes:     Extraocular Movements: Extraocular movements intact.     Conjunctiva/sclera: Conjunctivae normal.     Pupils: Pupils are equal, round, and reactive to light.  Cardiovascular:     Rate and Rhythm: Normal  rate and regular rhythm.  Pulmonary:     Effort: Pulmonary effort is normal. No respiratory distress.     Breath sounds: Normal breath sounds. No wheezing or rales.  Musculoskeletal:        General: Normal range of motion.     Cervical back: Normal range of motion and neck supple.  Lymphadenopathy:     Cervical: No cervical adenopathy.  Skin:    General: Skin is warm and dry.  Neurological:     General: No focal deficit present.     Mental Status: He is alert and oriented to person, place, and time.  Psychiatric:        Mood and Affect: Mood normal.        Behavior: Behavior normal.        Thought Content: Thought content normal.        Judgment: Judgment normal.     UC Treatments / Results  Labs (all labs ordered are listed, but only abnormal results are displayed) Labs Reviewed  COVID-19, FLU A+B NAA    EKG   Radiology No results found.  Procedures Procedures (including critical care time)  Medications Ordered in UC Medications - No data to display  Initial Impression / Assessment and Plan / UC Course  I have reviewed the triage vital signs and the nursing notes.  Pertinent labs & imaging results that were available during my care of the patient were reviewed by me and considered in my medical decision making (see chart for details).     Overall vital signs and exam reassuring and indicative of a viral upper respiratory infection.  Possibly developing into some bronchitis given persistence and worsening course of his cough.  We will treat with prednisone, Phenergan DM, viscous lidocaine and await COVID and flu results.  Discussed supportive home care and return precautions.  Final Clinical Impressions(s) / UC Diagnoses   Final diagnoses:  Exposure to the flu  Viral URI with cough   Discharge Instructions   None    ED Prescriptions     Medication Sig Dispense Auth. Provider   predniSONE (DELTASONE) 20 MG tablet Take 2 tablets (40 mg total) by mouth  daily with breakfast. 6 tablet Particia Nearing, PA-C   lidocaine (XYLOCAINE) 2 % solution Use as directed 10 mLs in the mouth or throat  as needed for mouth pain. 100 mL Particia Nearing, PA-C   promethazine-dextromethorphan (PROMETHAZINE-DM) 6.25-15 MG/5ML syrup Take 5 mLs by mouth 4 (four) times daily as needed. 100 mL Particia Nearing, [place]      PDMP not reviewed this encounter.   Particia Nearing, [place] 02/19/21 [phone]

## 2021-02-20 LAB — COVID-19, FLU A+B NAA
Influenza A, NAA: NOT DETECTED
Influenza B, NAA: NOT DETECTED
SARS-CoV-2, NAA: NOT DETECTED
